# Patient Record
Sex: Female | Born: 1965 | Hispanic: No | Marital: Single | Smoking: Never smoker
Health system: Southern US, Community
[De-identification: ages and names within clinical notes are randomized; demographics above are authoritative.]

## PROBLEM LIST (undated history)

## (undated) DIAGNOSIS — G4730 Sleep apnea, unspecified: Secondary | ICD-10-CM

## (undated) DIAGNOSIS — K219 Gastro-esophageal reflux disease without esophagitis: Secondary | ICD-10-CM

## (undated) DIAGNOSIS — E079 Disorder of thyroid, unspecified: Secondary | ICD-10-CM

## (undated) DIAGNOSIS — E042 Nontoxic multinodular goiter: Secondary | ICD-10-CM

## (undated) DIAGNOSIS — F419 Anxiety disorder, unspecified: Secondary | ICD-10-CM

## (undated) HISTORY — DX: Disorder of thyroid, unspecified: E07.9

## (undated) HISTORY — PX: COLONOSCOPY: SHX174

## (undated) HISTORY — PX: ABDOMINAL HYSTERECTOMY: SHX81

## (undated) HISTORY — DX: Nontoxic multinodular goiter: E04.2

## (undated) HISTORY — DX: Sleep apnea, unspecified: G47.30

## (undated) HISTORY — DX: Anxiety disorder, unspecified: F41.9

## (undated) HISTORY — DX: Gastro-esophageal reflux disease without esophagitis: K21.9

---

## 1997-08-27 ENCOUNTER — Other Ambulatory Visit: Admission: RE | Admit: 1997-08-27 | Discharge: 1997-08-27 | Payer: Self-pay | Admitting: *Deleted

## 1997-09-06 ENCOUNTER — Ambulatory Visit (HOSPITAL_COMMUNITY): Admission: RE | Admit: 1997-09-06 | Discharge: 1997-09-06 | Payer: Self-pay | Admitting: *Deleted

## 1997-09-21 ENCOUNTER — Other Ambulatory Visit: Admission: RE | Admit: 1997-09-21 | Discharge: 1997-09-21 | Payer: Self-pay | Admitting: *Deleted

## 1997-10-04 ENCOUNTER — Other Ambulatory Visit: Admission: RE | Admit: 1997-10-04 | Discharge: 1997-10-04 | Payer: Self-pay | Admitting: *Deleted

## 1997-11-20 ENCOUNTER — Other Ambulatory Visit: Admission: RE | Admit: 1997-11-20 | Discharge: 1997-11-20 | Payer: Self-pay | Admitting: *Deleted

## 2016-11-26 ENCOUNTER — Emergency Department (HOSPITAL_COMMUNITY): Payer: Managed Care, Other (non HMO)

## 2016-11-26 ENCOUNTER — Encounter (HOSPITAL_COMMUNITY): Payer: Self-pay | Admitting: Emergency Medicine

## 2016-11-26 ENCOUNTER — Emergency Department (HOSPITAL_COMMUNITY)
Admission: EM | Admit: 2016-11-26 | Discharge: 2016-11-27 | Disposition: A | Discharge status: Home / Self Care | Payer: Managed Care, Other (non HMO) | Attending: Emergency Medicine | Admitting: Emergency Medicine

## 2016-11-26 DIAGNOSIS — R1011 Right upper quadrant pain: Secondary | ICD-10-CM | POA: Insufficient documentation

## 2016-11-26 LAB — COMPREHENSIVE METABOLIC PANEL
ALBUMIN: 3.9 g/dL (ref 3.5–5.0)
ALK PHOS: 70 U/L (ref 38–126)
ALT: 26 U/L (ref 14–54)
AST: 39 U/L (ref 15–41)
Anion gap: 10 (ref 5–15)
BILIRUBIN TOTAL: 0.4 mg/dL (ref 0.3–1.2)
BUN: 14 mg/dL (ref 6–20)
CALCIUM: 9.3 mg/dL (ref 8.9–10.3)
CO2: 24 mmol/L (ref 22–32)
CREATININE: 0.74 mg/dL (ref 0.44–1.00)
Chloride: 105 mmol/L (ref 101–111)
GFR calc non Af Amer: >60 mL/min (ref >60)
GLUCOSE: 110 mg/dL — AB (ref 65–99)
Potassium: 3.6 mmol/L (ref 3.5–5.1)
SODIUM: 139 mmol/L (ref 135–145)
TOTAL PROTEIN: 7.2 g/dL (ref 6.5–8.1)

## 2016-11-26 LAB — CBC
HCT: 39.3 % (ref 36.0–46.0)
Hemoglobin: 13.2 g/dL (ref 12.0–15.0)
MCH: 30.8 pg (ref 26.0–34.0)
MCHC: 33.6 g/dL (ref 30.0–36.0)
MCV: 91.8 fL (ref 78.0–100.0)
PLATELETS: 159 10*3/uL (ref 150–400)
RBC: 4.28 MIL/uL (ref 3.87–5.11)
RDW: 13.3 % (ref 11.5–15.5)
WBC: 7.9 10*3/uL (ref 4.0–10.5)

## 2016-11-26 LAB — URINALYSIS, ROUTINE W REFLEX MICROSCOPIC
Bilirubin Urine: NEGATIVE
GLUCOSE, UA: NEGATIVE mg/dL
Hgb urine dipstick: NEGATIVE
Ketones, ur: NEGATIVE mg/dL
Leukocytes, UA: NEGATIVE
NITRITE: POSITIVE — AB
PROTEIN: NEGATIVE mg/dL
SPECIFIC GRAVITY, URINE: 1.018 (ref 1.005–1.030)
pH: 8 (ref 5.0–8.0)

## 2016-11-26 LAB — LIPASE, BLOOD: Lipase: 19 U/L (ref 11–51)

## 2016-11-26 MED ORDER — FENTANYL CITRATE (PF) 100 MCG/2ML IJ SOLN
50.0000 ug | INTRAMUSCULAR | Status: DC | PRN
  Administered 2016-11-26: 50 ug via INTRAVENOUS
  Filled 2016-11-26: qty 2

## 2016-11-26 NOTE — ED Provider Notes (Signed)
[facility] DEPT Provider Note   CSN: [id number] Arrival date & time: 11/26/16  1847     History   Chief Complaint Chief Complaint  Patient presents with  . Abdominal Pain    HPI Tamara Snyder is a 51 y.o. female.  Patient present with acute onset of right upper quadrant abdominal pain that began around 6 PM today. Patient states pain is sharp constant and radiating around to her right upper back. She states pain was constant until she received fentanyl in triage here today. Reports pain significantly improved since then. Pain is associated with some nausea, no vomiting. She denies fever, diarrhea, constipation, urinary symptoms, chest pain, shortness of breath. Last BM was this morning. Denies history of gallstones. Previous abdominal surgeries include appendectomy and abdominal hysterectomy. She states she does not take daily medications. No other complaints today.      History reviewed. No pertinent past medical history.  There are no active problems to display for this patient.   Past Surgical History:  Procedure Laterality Date  . ABDOMINAL HYSTERECTOMY     partial    OB History    No data available       Home Medications    Prior to Admission medications   Medication Sig Start Date End Date Taking? Authorizing Provider  esomeprazole (NEXIUM) 40 MG capsule Take 40 mg by mouth once.   Yes [provider]  omeprazole (PRILOSEC) 40 MG capsule Take 40 mg by mouth daily as needed (indigestion).   Yes [provider]    Family History No family history on file.  Social History Social History  Substance Use Topics  . Smoking status: Not on file  . Smokeless tobacco: Not on file  . Alcohol use Not on file     Allergies   Other   Review of Systems Review of Systems  Constitutional: Positive for appetite change (slightly decreased today). Negative for fever.  HENT: Negative for trouble swallowing.   Respiratory: Negative for  shortness of breath.   Cardiovascular: Negative for chest pain.  Gastrointestinal: Positive for abdominal pain (RUQ) and nausea. Negative for constipation, diarrhea and vomiting.  Genitourinary: Negative for dysuria, frequency, vaginal bleeding and vaginal discharge.  Musculoskeletal: Positive for back pain (referred from abdomen).  Skin: Negative for color change.  Allergic/Immunologic: Negative for immunocompromised state.  Neurological: Negative for headaches.     Physical Exam Updated Vital Signs BP 105/69   Pulse 76   Temp 97.8 F (36.6 C) (Oral)   Resp 18   SpO2 97%   Physical Exam  Constitutional: She appears well-developed and well-nourished. No distress.  HENT:  Head: Normocephalic and atraumatic.  Mouth/Throat: Oropharynx is clear and moist.  Eyes: Conjunctivae are normal.  Cardiovascular: Normal rate, regular rhythm, normal heart sounds and intact distal pulses.  Exam reveals no friction rub.   No murmur heard. Pulmonary/Chest: Effort normal and breath sounds normal. No respiratory distress. She has no wheezes. She has no rales.  Abdominal: Soft. Normal appearance and bowel sounds are normal. She exhibits no distension and no mass. There is tenderness in the right upper quadrant and epigastric area. There is positive Murphy's sign. There is no rebound, no guarding, no CVA tenderness and no tenderness at McBurney's point.  Neurological: She is alert.  Skin: Skin is warm.  Psychiatric: She has a normal mood and affect. Her behavior is normal.  Nursing note and vitals reviewed.    ED Treatments / Results  Labs (all labs ordered are listed,  but only abnormal results are displayed) Labs Reviewed  COMPREHENSIVE METABOLIC PANEL - Abnormal; Notable for the following:       Result Value   Glucose, Bld 110 (*)    All other components within normal limits  URINALYSIS, ROUTINE W REFLEX MICROSCOPIC - Abnormal; Notable for the following:    APPearance HAZY (*)    Nitrite  POSITIVE (*)    Bacteria, UA MANY (*)    Squamous Epithelial / LPF 0-5 (*)    All other components within normal limits  LIPASE, BLOOD  CBC    EKG  EKG Interpretation None       Radiology US Abdomen Limited Ruq  Result Date: 11/26/2016 CLINICAL DATA:  Acute onset of right upper quadrant abdominal pain. Initial encounter. EXAM: ULTRASOUND ABDOMEN LIMITED RIGHT UPPER QUADRANT COMPARISON:  CT of the abdomen and pelvis performed 11/02/2008 FINDINGS: Gallbladder: Two small polyps are noted within the gallbladder, measuring up to 5 mm in size. Mild sludge is noted within the gallbladder. No gallbladder wall thickening or pericholecystic fluid is seen. No ultrasonographic Murphy's sign is elicited. Common bile duct: Diameter: 0.5 cm, within normal limits in caliber. Liver: No focal lesion identified. A mildly complex cyst is noted at the left hepatic lobe, measuring 1.2 cm in size, with a thin septation. IMPRESSION: 1. No acute abnormality seen at the right upper quadrant. 2. Small gallbladder polyps noted, with mild sludge in the gallbladder. Gallbladder otherwise unremarkable. 3. Mildly complex left hepatic cyst noted. Electronically Signed   By: Garald Balding M.D.   On: 11/26/2016 23:11    Procedures Procedures (including critical care time)  Medications Ordered in ED Medications  fentaNYL (SUBLIMAZE) injection 50 mcg (50 mcg Intravenous Given 11/26/16 1912)     Initial Impression / Assessment and Plan / ED Course  I have reviewed the triage vital signs and the nursing notes.  Pertinent labs & imaging results that were available during my care of the patient were reviewed by me and considered in my medical decision making (see chart for details).     Pt w RUQ abdominal pain. Patient is nontoxic, nonseptic appearing, in no apparent distress.  Patient's pain and other symptoms adequately managed in emergency department. Labs, imaging and vitals reviewed. Labs unremarkable, patient is  afebrile. Right upper quadrant ultrasound with sludge in the gallbladder, no gallstones, no cholecystitis. Patient does not meet the SIRS or Sepsis criteria.  On repeat exam patient does not have a surgical abdomen and there are no peritoneal signs. Patient discharged home with symptomatic treatment and given strict instructions for follow-up with their primary care physician.  Pt safe for discharge.  Patient discussed with Dr. Regenia Skeeter.  Discussed results, findings, treatment and follow up. Patient advised of return precautions. Patient verbalized understanding and agreed with plan.  Final Clinical Impressions(s) / ED Diagnoses   Final diagnoses:  RUQ abdominal pain    New Prescriptions New Prescriptions   No medications on file     Russo, [place] N, PA-C 11/27/16 0008    Sherwood Gambler, MD 11/29/16 2245

## 2016-11-26 NOTE — ED Triage Notes (Addendum)
Pt c/o severe RUQ abdominal pain, radiating around to right upper back, nausea onset today at 1800 while in car. No emesis or diarrhea. No hx gall bladder disease.

## 2016-11-27 NOTE — Discharge Instructions (Signed)
Please read instructions below. You can take advil or tylenol as needed for pain. Follow up with your primary care provider about your ultrasound results. Return to the ER for worsening pain, or new or concerning symptoms.

## 2017-07-21 IMAGING — US US ABDOMEN LIMITED
1 series · 14 of 25 positions shown · non-contrast
Comparison: CT of the abdomen and pelvis performed 11/02/2008

CLINICAL DATA: Acute onset of right upper quadrant abdominal pain.
Initial encounter.

EXAM:
ULTRASOUND ABDOMEN LIMITED RIGHT UPPER QUADRANT

[Series 1: us abdomen limited · 0.22mm/px · 14 of 59 slices shown]
[im 1/59]
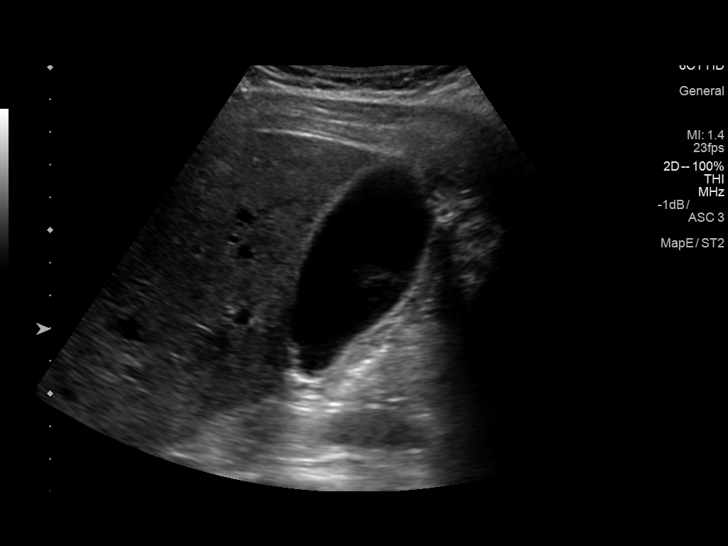
[im 5/59]
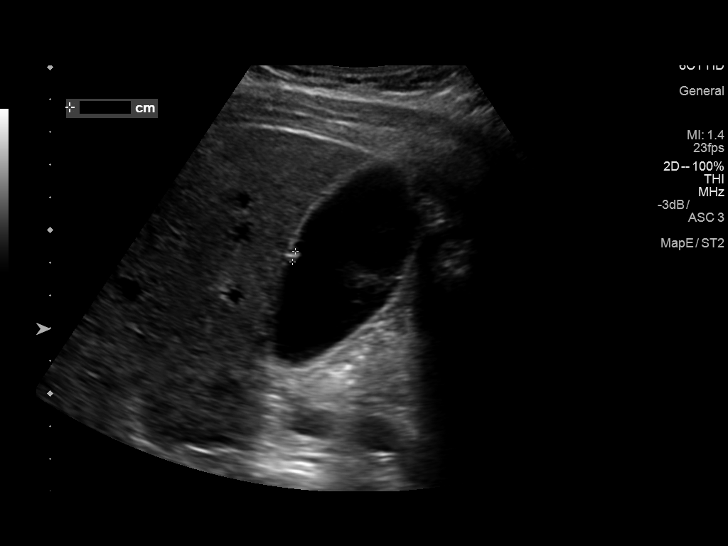
[im 10/59]
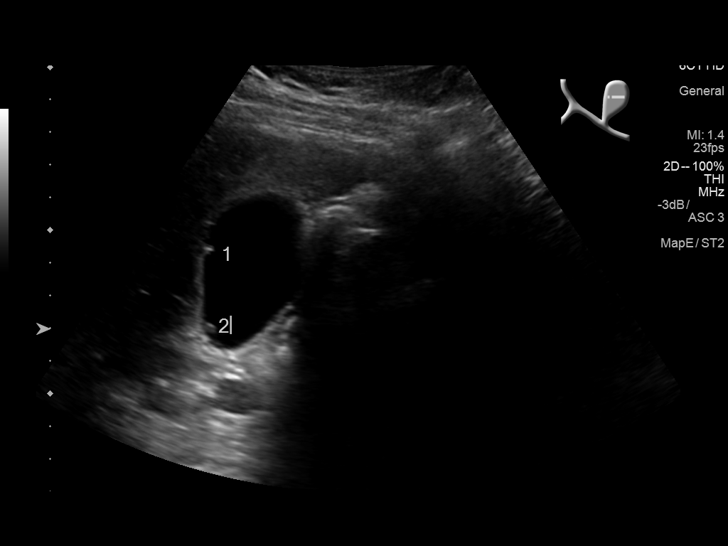
[im 15/59]
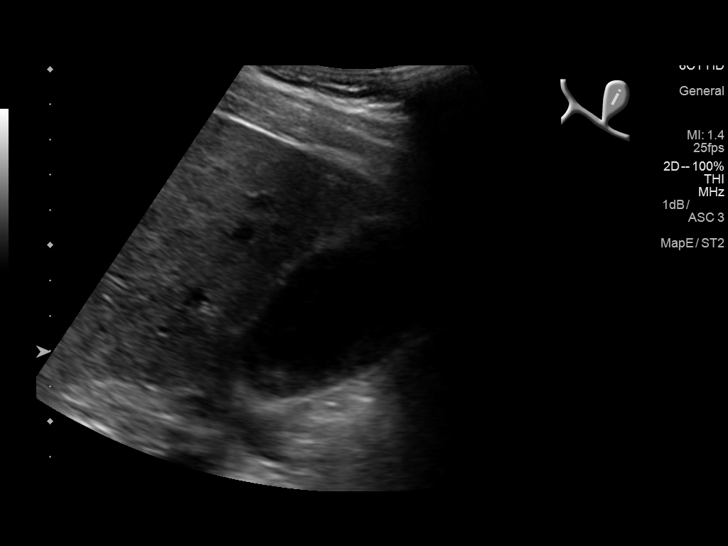
[im 20/59]
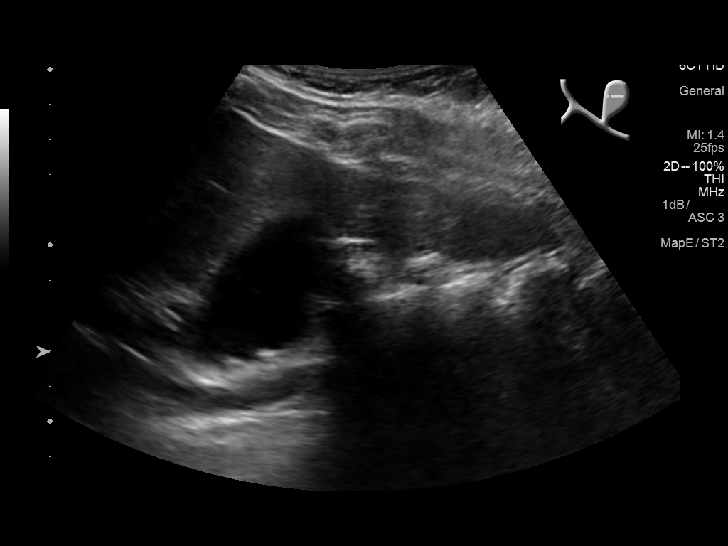
[im 22/59]
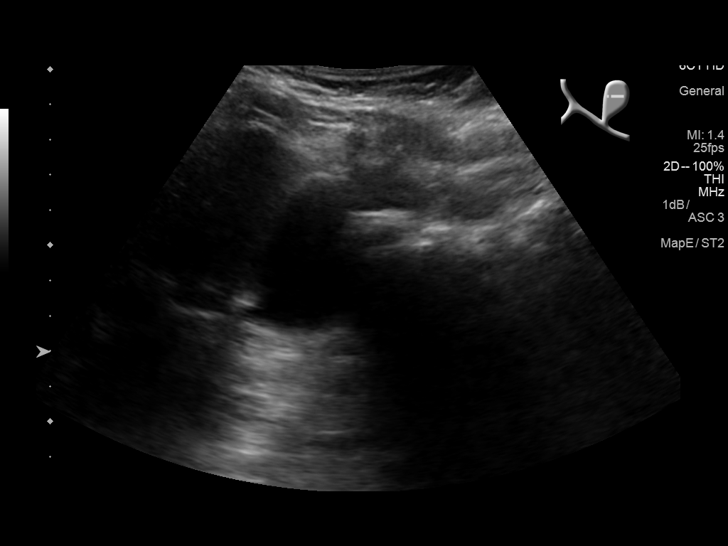
[im 27/59]
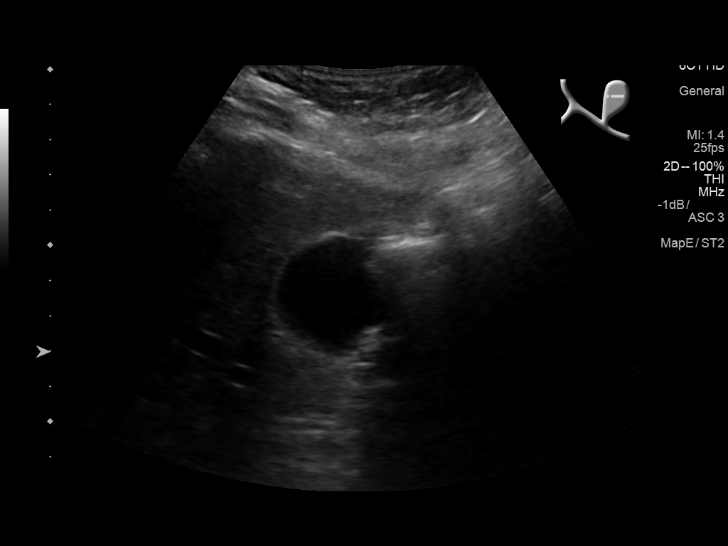
[im 32/59]
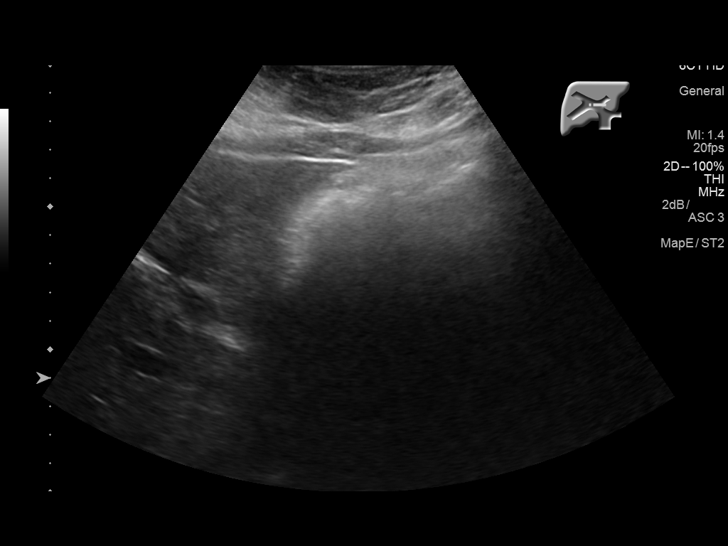
[im 37/59]
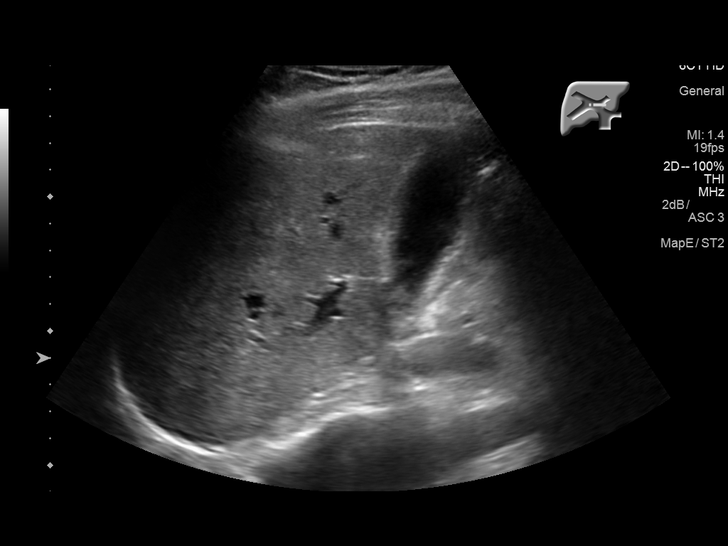
[im 39/59]
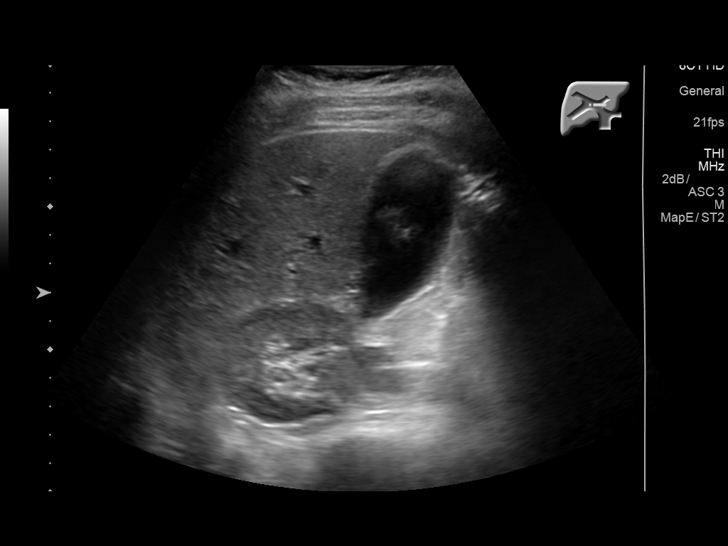
[im 44/59]
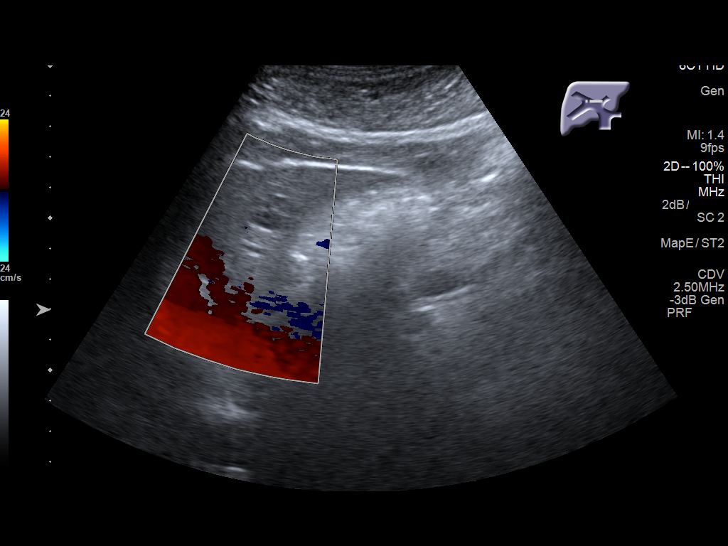
[im 49/59]
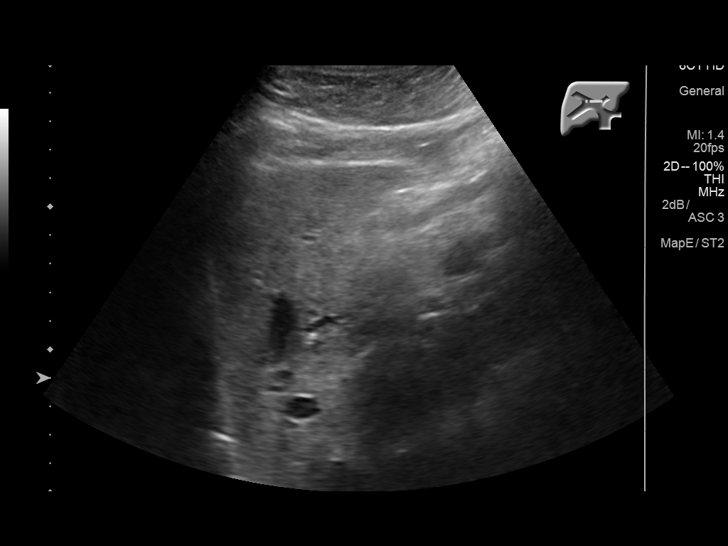
[im 54/59]
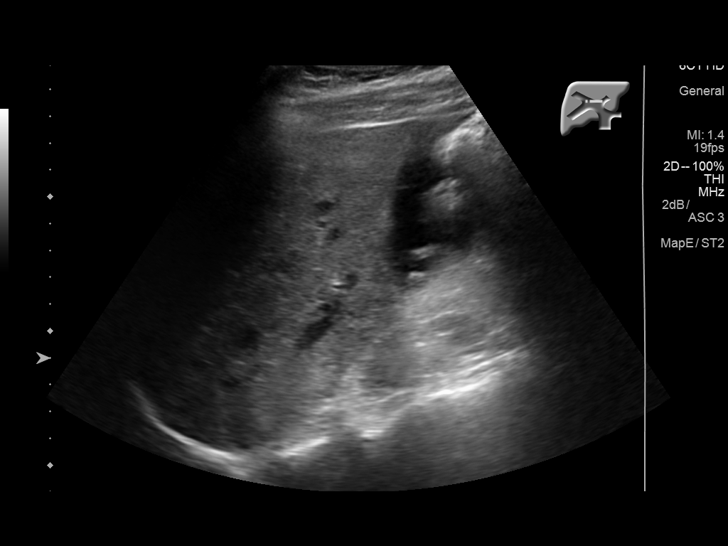
[im 59/59]
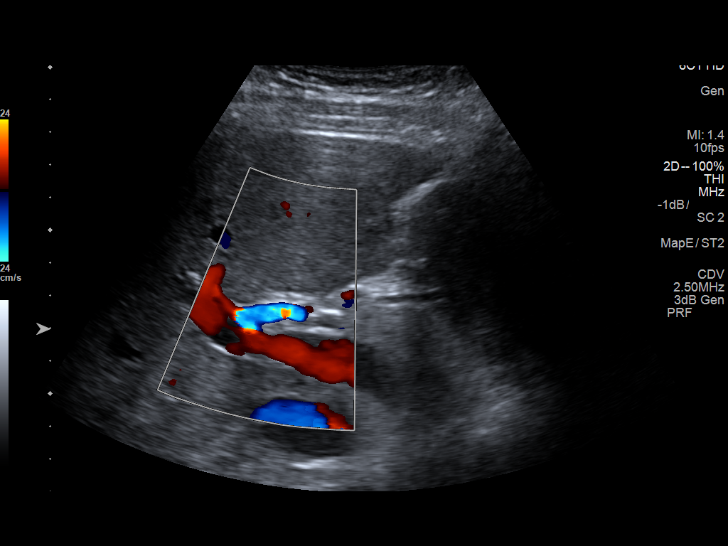

[14 of 25 positions shown; findings below may reference images not displayed]

FINDINGS: Gallbladder:

Two small polyps are noted within the gallbladder, measuring up to 5
mm in size. Mild sludge is noted within the gallbladder. No
gallbladder wall thickening or pericholecystic fluid is seen. No
ultrasonographic Murphy's sign is elicited.

Common bile duct:

Diameter: 0.5 cm, within normal limits in caliber.

Liver:

No focal lesion identified. A mildly complex cyst is noted at the
left hepatic lobe, measuring 1.2 cm in size, with a thin septation.
IMPRESSION: 1. No acute abnormality seen at the right upper quadrant.
2. Small gallbladder polyps noted, with mild sludge in the
gallbladder. Gallbladder otherwise unremarkable.
3. Mildly complex left hepatic cyst noted.

## 2017-09-20 DIAGNOSIS — Z79899 Other long term (current) drug therapy: Secondary | ICD-10-CM | POA: Diagnosis not present

## 2017-09-20 DIAGNOSIS — K219 Gastro-esophageal reflux disease without esophagitis: Secondary | ICD-10-CM | POA: Diagnosis not present

## 2017-09-20 DIAGNOSIS — K8010 Calculus of gallbladder with chronic cholecystitis without obstruction: Secondary | ICD-10-CM | POA: Diagnosis not present

## 2017-12-10 DIAGNOSIS — Z1339 Encounter for screening examination for other mental health and behavioral disorders: Secondary | ICD-10-CM | POA: Diagnosis not present

## 2017-12-10 DIAGNOSIS — N951 Menopausal and female climacteric states: Secondary | ICD-10-CM | POA: Diagnosis not present

## 2017-12-10 DIAGNOSIS — Z1331 Encounter for screening for depression: Secondary | ICD-10-CM | POA: Diagnosis not present

## 2017-12-10 DIAGNOSIS — M2550 Pain in unspecified joint: Secondary | ICD-10-CM | POA: Diagnosis not present

## 2017-12-13 DIAGNOSIS — Z13220 Encounter for screening for lipoid disorders: Secondary | ICD-10-CM | POA: Diagnosis not present

## 2017-12-13 DIAGNOSIS — M2550 Pain in unspecified joint: Secondary | ICD-10-CM | POA: Diagnosis not present

## 2017-12-13 DIAGNOSIS — Z79899 Other long term (current) drug therapy: Secondary | ICD-10-CM | POA: Diagnosis not present

## 2017-12-13 DIAGNOSIS — Z0000 Encounter for general adult medical examination without abnormal findings: Secondary | ICD-10-CM | POA: Diagnosis not present

## 2017-12-13 DIAGNOSIS — N951 Menopausal and female climacteric states: Secondary | ICD-10-CM | POA: Diagnosis not present

## 2017-12-17 DIAGNOSIS — M71572 Other bursitis, not elsewhere classified, left ankle and foot: Secondary | ICD-10-CM | POA: Diagnosis not present

## 2017-12-17 DIAGNOSIS — M722 Plantar fascial fibromatosis: Secondary | ICD-10-CM | POA: Diagnosis not present

## 2017-12-17 DIAGNOSIS — M7731 Calcaneal spur, right foot: Secondary | ICD-10-CM | POA: Diagnosis not present

## 2017-12-17 DIAGNOSIS — M71571 Other bursitis, not elsewhere classified, right ankle and foot: Secondary | ICD-10-CM | POA: Diagnosis not present

## 2017-12-17 DIAGNOSIS — M7732 Calcaneal spur, left foot: Secondary | ICD-10-CM | POA: Diagnosis not present

## 2017-12-23 DIAGNOSIS — M722 Plantar fascial fibromatosis: Secondary | ICD-10-CM | POA: Diagnosis not present

## 2017-12-23 DIAGNOSIS — M71572 Other bursitis, not elsewhere classified, left ankle and foot: Secondary | ICD-10-CM | POA: Diagnosis not present

## 2017-12-23 DIAGNOSIS — M71571 Other bursitis, not elsewhere classified, right ankle and foot: Secondary | ICD-10-CM | POA: Diagnosis not present

## 2017-12-31 DIAGNOSIS — M722 Plantar fascial fibromatosis: Secondary | ICD-10-CM | POA: Diagnosis not present

## 2017-12-31 DIAGNOSIS — M71571 Other bursitis, not elsewhere classified, right ankle and foot: Secondary | ICD-10-CM | POA: Diagnosis not present

## 2017-12-31 DIAGNOSIS — M71572 Other bursitis, not elsewhere classified, left ankle and foot: Secondary | ICD-10-CM | POA: Diagnosis not present

## 2018-04-01 DIAGNOSIS — Z23 Encounter for immunization: Secondary | ICD-10-CM | POA: Diagnosis not present

## 2018-04-13 DIAGNOSIS — Z1211 Encounter for screening for malignant neoplasm of colon: Secondary | ICD-10-CM | POA: Diagnosis not present

## 2018-04-13 DIAGNOSIS — D124 Benign neoplasm of descending colon: Secondary | ICD-10-CM | POA: Diagnosis not present

## 2018-04-13 DIAGNOSIS — Z800 Family history of malignant neoplasm of digestive organs: Secondary | ICD-10-CM | POA: Diagnosis not present

## 2018-05-25 DIAGNOSIS — J411 Mucopurulent chronic bronchitis: Secondary | ICD-10-CM | POA: Diagnosis not present

## 2020-05-25 HISTORY — PX: FINGER SURGERY: SHX640

## 2020-12-22 ENCOUNTER — Encounter (HOSPITAL_COMMUNITY): Payer: Self-pay | Admitting: *Deleted

## 2020-12-22 ENCOUNTER — Ambulatory Visit (INDEPENDENT_AMBULATORY_CARE_PROVIDER_SITE_OTHER): Payer: 59

## 2020-12-22 ENCOUNTER — Other Ambulatory Visit: Payer: Self-pay

## 2020-12-22 ENCOUNTER — Ambulatory Visit (HOSPITAL_COMMUNITY)
Admission: EM | Admit: 2020-12-22 | Discharge: 2020-12-22 | Disposition: A | Discharge status: Home / Self Care | Payer: 59 | Attending: Internal Medicine | Admitting: Internal Medicine

## 2020-12-22 DIAGNOSIS — S62630A Displaced fracture of distal phalanx of right index finger, initial encounter for closed fracture: Secondary | ICD-10-CM | POA: Diagnosis not present

## 2020-12-22 NOTE — ED Triage Notes (Signed)
Pt injured Rt middle finger this AM . Deformity to RT middle finger.

## 2020-12-22 NOTE — ED Provider Notes (Signed)
[facility]    CSN: [id number] Arrival date & time: 12/22/20  1010      History   Chief Complaint Chief Complaint  Patient presents with   Finger Injury    HPI Tamara Snyder is a 55 y.o. female.   Patient presenting today with 1 day history of right pointer finger pain after she was wiping her couch down and her finger caught on something and she heard an audible pop.  Has had a deformity to the distal fingertip since incident and numbness of the fingertip.  Denies discoloration, coolness, skin injury, other areas of pain on the hand.  So far is not tried anything for symptoms, came straight here.   History reviewed. No pertinent past medical history.  There are no problems to display for this patient.   Past Surgical History:  Procedure Laterality Date   ABDOMINAL HYSTERECTOMY     partial    OB History   No obstetric history on file.      Home Medications    Prior to Admission medications   Medication Sig Start Date End Date Taking? Authorizing Provider  esomeprazole (NEXIUM) 40 MG capsule Take 40 mg by mouth once.    [provider]  omeprazole (PRILOSEC) 40 MG capsule Take 40 mg by mouth daily as needed (indigestion).    [provider]    Family History History reviewed. No pertinent family history.  Social History Social History   Tobacco Use   Smoking status: Never   Smokeless tobacco: Never     Allergies   Other   Review of Systems Review of Systems Per HPI  Physical Exam Triage Vital Signs ED Triage Vitals  Enc Vitals Group     BP 12/22/20 1046 128/88     Pulse Rate 12/22/20 1046 81     Resp 12/22/20 1046 20     Temp 12/22/20 1046 98.3 F (36.8 C)     Temp src --      SpO2 12/22/20 1046 100 %     Weight --      Height --      Head Circumference --      Peak Flow --      Pain Score 12/22/20 1043 5     Pain Loc --      Pain Edu? --      Excl. in [place]? --    No data found.  Updated Vital  Signs BP 128/88   Pulse 81   Temp 98.3 F (36.8 C)   Resp 20   SpO2 100%   Visual Acuity Right Eye Distance:   Left Eye Distance:   Bilateral Distance:    Right Eye Near:   Left Eye Near:    Bilateral Near:     Physical Exam Vitals and nursing note reviewed.  Constitutional:      Appearance: Normal appearance. She is not ill-appearing.  HENT:     Head: Atraumatic.  Eyes:     Extraocular Movements: Extraocular movements intact.     Conjunctiva/sclera: Conjunctivae normal.  Cardiovascular:     Rate and Rhythm: Normal rate and regular rhythm.     Pulses: Normal pulses.     Heart sounds: Normal heart sounds.     Comments: Normal cap refill 2 seconds right distal finger Pulmonary:     Effort: Pulmonary effort is normal.     Breath sounds: Normal breath sounds.  Musculoskeletal:        General: Swelling, tenderness, deformity  and signs of injury present. Normal range of motion.     Cervical back: Normal range of motion and neck supple.     Comments: Obvious deformity to distal right index finger, tender to palpation on dorsal aspect.  No range of motion at this joint  Skin:    General: Skin is warm and dry.     Findings: No erythema.  Neurological:     Mental Status: She is alert and oriented to person, place, and time.     Comments: Decreased sensation to light touch  Psychiatric:        Mood and Affect: Mood normal.        Thought Content: Thought content normal.        Judgment: Judgment normal.    UC Treatments / Results  Labs (all labs ordered are listed, but only abnormal results are displayed) Labs Reviewed - No data to display  EKG   Radiology DG Finger Middle Right  Result Date: 12/22/2020 CLINICAL DATA:  Finger got stuck in a rag while cleaning her leather couch this morning. No previous injuries EXAM: RIGHT MIDDLE FINGER 2+V COMPARISON:  None. FINDINGS: There is a small avulsion fracture at the base of the distal phalanx of the middle finger best seen  on lateral view. The fracture fragment is displaced by approximately 4 mm. No evidence of dislocation. No other acute finding identified in the middle finger. Regional soft tissues unremarkable. IMPRESSION: Small avulsion fracture at the base of the distal phalanx of the right middle finger. Electronically Signed   By: Audie Pinto M.D.   On: 12/22/2020 11:00    Procedures Procedures (including critical care time)  Medications Ordered in UC Medications - No data to display  Initial Impression / Assessment and Plan / UC Course  I have reviewed the triage vital signs and the nursing notes.  Pertinent labs & imaging results that were available during my care of the patient were reviewed by me and considered in my medical decision making (see chart for details).     X-ray revealing avulsion fracture to right distal phalanx, finger splint placed and orthopedic information given with recommendation to call first thing tomorrow morning for appointment.  Restrictions reviewed, ED for any discoloration, coolness or worsening loss of sensation.  RICE protocol also reviewed, over-the-counter pain relievers as needed.  Final Clinical Impressions(s) / UC Diagnoses   Final diagnoses:  Closed displaced fracture of distal phalanx of right index finger, initial encounter   Discharge Instructions   None    ED Prescriptions   None    PDMP not reviewed this encounter.   Volney American, [place] 12/22/20 1132

## 2020-12-31 ENCOUNTER — Other Ambulatory Visit: Payer: Self-pay | Admitting: Internal Medicine

## 2020-12-31 DIAGNOSIS — E042 Nontoxic multinodular goiter: Secondary | ICD-10-CM

## 2021-01-07 ENCOUNTER — Ambulatory Visit
Admission: RE | Admit: 2021-01-07 | Discharge: 2021-01-07 | Disposition: A | Discharge status: Home / Self Care | Payer: 59 | Source: Ambulatory Visit | Attending: Internal Medicine | Admitting: Internal Medicine

## 2021-01-07 ENCOUNTER — Other Ambulatory Visit: Payer: Self-pay

## 2021-01-07 DIAGNOSIS — E042 Nontoxic multinodular goiter: Secondary | ICD-10-CM

## 2022-01-05 ENCOUNTER — Other Ambulatory Visit: Payer: Self-pay | Admitting: *Deleted

## 2022-01-05 DIAGNOSIS — E042 Nontoxic multinodular goiter: Secondary | ICD-10-CM

## 2022-01-16 ENCOUNTER — Other Ambulatory Visit: Payer: Self-pay | Admitting: Family Medicine

## 2022-01-16 DIAGNOSIS — Z1231 Encounter for screening mammogram for malignant neoplasm of breast: Secondary | ICD-10-CM

## 2022-01-19 ENCOUNTER — Ambulatory Visit
Admission: RE | Admit: 2022-01-19 | Discharge: 2022-01-19 | Disposition: A | Discharge status: Home / Self Care | Payer: 59 | Source: Ambulatory Visit | Attending: Family Medicine | Admitting: Family Medicine

## 2022-01-19 ENCOUNTER — Ambulatory Visit
Admission: RE | Admit: 2022-01-19 | Discharge: 2022-01-19 | Disposition: A | Discharge status: Home / Self Care | Payer: 59 | Source: Ambulatory Visit | Attending: Internal Medicine | Admitting: Internal Medicine

## 2022-01-19 DIAGNOSIS — Z1231 Encounter for screening mammogram for malignant neoplasm of breast: Secondary | ICD-10-CM

## 2022-01-19 DIAGNOSIS — E042 Nontoxic multinodular goiter: Secondary | ICD-10-CM

## 2022-01-20 ENCOUNTER — Encounter: Payer: Self-pay | Admitting: Gastroenterology

## 2022-02-02 ENCOUNTER — Other Ambulatory Visit: Payer: Self-pay

## 2022-02-02 ENCOUNTER — Telehealth: Payer: Self-pay | Admitting: *Deleted

## 2022-02-02 ENCOUNTER — Encounter: Payer: Self-pay | Admitting: Gastroenterology

## 2022-02-02 ENCOUNTER — Ambulatory Visit (AMBULATORY_SURGERY_CENTER): Payer: Self-pay | Admitting: *Deleted

## 2022-02-02 VITALS — Ht 66.0 in | Wt 192.0 lb

## 2022-02-02 DIAGNOSIS — Z86010 Personal history of colonic polyps: Secondary | ICD-10-CM

## 2022-02-02 MED ORDER — NA SULFATE-K SULFATE-MG SULF 17.5-3.13-1.6 GM/177ML PO SOLN: 1.0000 | Freq: Once | ORAL | 0 refills | Status: AC

## 2022-02-02 NOTE — Progress Notes (Signed)
Completed pre visit with patient in person today. Patient has chronic constipation that is treated with daily Miralax. Patient will continue 1-2 times daily starting 1 week before colonoscopy.  No egg or soy allergy known to patient  No issues known to pt with past sedation with any surgeries or procedures Patient denies ever being told they had issues or difficulty with intubation  No FH of Malignant Hyperthermia Pt is not on diet pills Pt is not on  home 02  Pt is not on blood thinners  Pt denies issues with constipation  No A fib or A flutter  Pt instructed to use Single[employer]com or GoodRx for a price reduction on prep

## 2022-02-02 NOTE — Telephone Encounter (Signed)
Dr Lyndel Safe, This patient is transferring care to you from Dr Melina Copa.  She is on Pantoprazole for known GERD.  She has been having increased symptoms lately and was inquiring if she could have an EGD with her colonoscopy. Please advise. Thanks.  Raquel Sarna

## 2022-02-12 ENCOUNTER — Telehealth: Payer: Self-pay | Admitting: Gastroenterology

## 2022-02-12 NOTE — Telephone Encounter (Signed)
Good morning patient request a call back , she want to go over pre-visit instructions, she is not home and don't have paper with her.

## 2022-02-12 NOTE — Telephone Encounter (Signed)
Spoke with the patient. Went over prep instructions because she was at the beach and did not have them with her. Answered all questions.

## 2022-02-16 ENCOUNTER — Encounter: Payer: Self-pay | Admitting: Gastroenterology

## 2022-02-16 ENCOUNTER — Ambulatory Visit (AMBULATORY_SURGERY_CENTER): Payer: 59 | Admitting: Gastroenterology

## 2022-02-16 VITALS — BP 111/73 | HR 76 | Temp 97.8°F | Resp 17 | Ht 67.0 in | Wt 192.0 lb

## 2022-02-16 DIAGNOSIS — K317 Polyp of stomach and duodenum: Secondary | ICD-10-CM | POA: Diagnosis not present

## 2022-02-16 DIAGNOSIS — Z800 Family history of malignant neoplasm of digestive organs: Secondary | ICD-10-CM | POA: Diagnosis not present

## 2022-02-16 DIAGNOSIS — D122 Benign neoplasm of ascending colon: Secondary | ICD-10-CM

## 2022-02-16 DIAGNOSIS — Z09 Encounter for follow-up examination after completed treatment for conditions other than malignant neoplasm: Secondary | ICD-10-CM

## 2022-02-16 DIAGNOSIS — R12 Heartburn: Secondary | ICD-10-CM | POA: Diagnosis not present

## 2022-02-16 DIAGNOSIS — K449 Diaphragmatic hernia without obstruction or gangrene: Secondary | ICD-10-CM | POA: Diagnosis not present

## 2022-02-16 DIAGNOSIS — Q398 Other congenital malformations of esophagus: Secondary | ICD-10-CM

## 2022-02-16 DIAGNOSIS — K219 Gastro-esophageal reflux disease without esophagitis: Secondary | ICD-10-CM

## 2022-02-16 DIAGNOSIS — K2900 Acute gastritis without bleeding: Secondary | ICD-10-CM

## 2022-02-16 DIAGNOSIS — K319 Disease of stomach and duodenum, unspecified: Secondary | ICD-10-CM | POA: Diagnosis not present

## 2022-02-16 DIAGNOSIS — Z86010 Personal history of colonic polyps: Secondary | ICD-10-CM

## 2022-02-16 DIAGNOSIS — D124 Benign neoplasm of descending colon: Secondary | ICD-10-CM

## 2022-02-16 MED ORDER — SODIUM CHLORIDE 0.9 % IV SOLN: 500.0000 mL | Freq: Once | INTRAVENOUS | Status: DC

## 2022-02-16 MED ORDER — PANTOPRAZOLE SODIUM 20 MG PO TBEC: 20.0000 mg | DELAYED_RELEASE_TABLET | Freq: Two times a day (BID) | ORAL | 4 refills | Status: DC

## 2022-02-16 NOTE — Progress Notes (Signed)
[facility] History and Physical   Primary Care Physician:  Mateo Flow, MD   Reason for Procedure:   FH CRC (dad), GERD  Plan:    EGD/colon     HPI: Tamara Snyder is a 56 y.o. female    Past Medical History:  Diagnosis Date   Anxiety    GERD (gastroesophageal reflux disease)    Multiple thyroid nodules    Sleep apnea    dental appliance   Thyroid disease     Past Surgical History:  Procedure Laterality Date   ABDOMINAL HYSTERECTOMY     partial   COLONOSCOPY     FINGER SURGERY Right 2022    Prior to Admission medications   Medication Sig Start Date End Date Taking? Authorizing Provider  Cholecalciferol 50 MCG (2000 UT) TABS 1 tablet Orally Once a day   Yes [provider]  escitalopram (LEXAPRO) 10 MG tablet Take 10 mg by mouth every morning. 01/12/22  Yes [provider]  pantoprazole (PROTONIX) 20 MG tablet Take 20 mg by mouth daily. 01/12/22  Yes [provider]  esomeprazole (NEXIUM) 40 MG capsule Take 40 mg by mouth once.    [provider]  ibuprofen (ADVIL) 400 MG tablet Take 400 mg by mouth every 6 (six) hours as needed.    [provider]  omeprazole (PRILOSEC) 40 MG capsule Take 40 mg by mouth daily as needed (indigestion).    [provider]    Current Outpatient Medications  Medication Sig Dispense Refill   Cholecalciferol 50 MCG (2000 UT) TABS 1 tablet Orally Once a day     escitalopram (LEXAPRO) 10 MG tablet Take 10 mg by mouth every morning.     pantoprazole (PROTONIX) 20 MG tablet Take 20 mg by mouth daily.     esomeprazole (NEXIUM) 40 MG capsule Take 40 mg by mouth once.     ibuprofen (ADVIL) 400 MG tablet Take 400 mg by mouth every 6 (six) hours as needed.     omeprazole (PRILOSEC) 40 MG capsule Take 40 mg by mouth daily as needed (indigestion).     Current Facility-Administered Medications  Medication Dose Route Frequency Provider Last Rate Last Admin   0.9 %  sodium  chloride infusion  500 mL Intravenous Once Jackquline Denmark, MD        Allergies as of 02/16/2022 - Review Complete 02/16/2022  Allergen Reaction Noted   Other  11/26/2016    Family History  Problem Relation Age of Onset   Colon cancer Father    Esophageal cancer Neg Hx    Stomach cancer Neg Hx    Rectal cancer Neg Hx     Social History   Socioeconomic History   Marital status: Single    Spouse name: Not on file   Number of children: Not on file   Years of education: Not on file   Highest education level: Not on file  Occupational History   Not on file  Tobacco Use   Smoking status: Never   Smokeless tobacco: Never  Substance and Sexual Activity   Alcohol use: Not on file   Drug use: Not on file   Sexual activity: Not on file  Other Topics Concern   Not on file  Social History Narrative   Not on file   Social Determinants of Health   Financial Resource Strain: Not on file  Food Insecurity: Not on file  Transportation Needs: Not on file  Physical Activity: Not on file  Stress:  Not on file  Social Connections: Not on file  Intimate Partner Violence: Not on file    Review of Systems: Positive for none All other review of systems negative except as mentioned in the HPI.  Physical Exam: Vital signs in last 24 hours: '@VSRANGES'$ @   General:   Alert,  Well-developed, well-nourished, pleasant and cooperative in NAD Lungs:  Clear throughout to auscultation.   Heart:  Regular rate and rhythm; no murmurs, clicks, rubs,  or gallops. Abdomen:  Soft, nontender and nondistended. Normal bowel sounds.   Neuro/Psych:  Alert and cooperative. Normal mood and affect. A and O x 3    No significant changes were identified.  The patient continues to be an appropriate candidate for the planned procedure and anesthesia.   Carmell Austria, MD. [facility] 02/16/2022 2:11 PM@

## 2022-02-16 NOTE — Progress Notes (Signed)
Pt's states no medical or surgical changes since previsit or office visit. 

## 2022-02-16 NOTE — Progress Notes (Signed)
PT taken to PACU. Monitors in place. VSS. Report given to RN. 

## 2022-02-16 NOTE — Op Note (Addendum)
[facility] Patient Name: Tamara Snyder Procedure Date: 02/16/2022 2:47 PM MRN: [id number] Endoscopist: Jackquline Denmark , MD Age: 56 Referring MD:  Date of Birth: 03-Aug-1965 Gender: Female Account #: [id number] Procedure:                Colonoscopy Indications:              High risk colon cancer surveillance: Personal                            history of colonic polyps. FH CRC (dad) Medicines:                Monitored Anesthesia Care Procedure:                Pre-Anesthesia Assessment:                           - Prior to the procedure, a History and Physical                            was performed, and patient medications and                            allergies were reviewed. The patient's tolerance of                            previous anesthesia was also reviewed. The risks                            and benefits of the procedure and the sedation                            options and risks were discussed with the patient.                            All questions were answered, and informed consent                            was obtained. Prior Anticoagulants: The patient has                            taken no previous anticoagulant or antiplatelet                            agents. ASA Grade Assessment: II - A patient with                            mild systemic disease. After reviewing the risks                            and benefits, the patient was deemed in                            satisfactory condition to undergo the procedure.  After obtaining informed consent, the colonoscope                            was passed under direct vision. Throughout the                            procedure, the patient's blood pressure, pulse, and                            oxygen saturations were monitored continuously. The                            Olympus PCF-H190DL ([id number]) Colonoscope was                            introduced through the  anus and advanced to the 2                            cm into the ileum. The colonoscopy was performed                            without difficulty. The patient tolerated the                            procedure well. The quality of the bowel                            preparation was good. The terminal ileum, ileocecal                            valve, appendiceal orifice, and rectum were                            photographed. Scope In: 3:09:54 PM Scope Out: 3:30:02 PM Scope Withdrawal Time: 0 hours 14 minutes 5 seconds  Total Procedure Duration: 0 hours 20 minutes 8 seconds  Findings:                 Two sessile polyps were found in the proximal                            descending colon and mid ascending colon. The                            polyps were 4 to 6 mm in size. These polyps were                            removed with a cold snare. Resection and retrieval                            were complete.                           Multiple medium-mouthed diverticula were found in  the sigmoid colon, few in descending colon and                            ascending colon.                           Non-bleeding internal hemorrhoids were found during                            retroflexion. The hemorrhoids were small and Grade                            I (internal hemorrhoids that do not prolapse).                           The terminal ileum appeared normal.                           The exam was otherwise without abnormality on                            direct and retroflexion views. Complications:            No immediate complications. Estimated Blood Loss:     Estimated blood loss: none. Impression:               - Two 4 to 6 mm polyps in the proximal descending                            colon and in the mid ascending colon, removed with                            a cold snare. Resected and retrieved.                           - Pancolonic  diverticulosis predominantly in the                            sigmoid colon.                           - Non-bleeding internal hemorrhoids.                           - The examined portion of the ileum was normal.                           - The examination was otherwise normal on direct                            and retroflexion views. Recommendation:           - Patient has a contact number available for                            emergencies. The signs and symptoms of potential  delayed complications were discussed with the                            patient. Return to normal activities tomorrow.                            Written discharge instructions were provided to the                            patient.                           - Resume previous high-fiber diet.                           - Continue present medications.                           - Await pathology results.                           - Repeat colonoscopy for surveillance based on                            pathology results.                           - The findings and recommendations were discussed                            with the patient's family. Jackquline Denmark, MD 02/16/2022 3:35:38 PM This report has been signed electronically.

## 2022-02-16 NOTE — Telephone Encounter (Signed)
Absolutely lets set her up for EGD at the time of colonoscopy RG

## 2022-02-16 NOTE — Op Note (Addendum)
[facility] Patient Name: Tamara Snyder Procedure Date: 02/16/2022 2:48 PM MRN: [id number] Endoscopist: Jackquline Denmark , MD Age: 56 Referring MD:  Date of Birth: July 14, 1965 Gender: Female Account #: [id number] Procedure:                Upper GI endoscopy Indications:              Heartburn despite PPIs Medicines:                Monitored Anesthesia Care Procedure:                Pre-Anesthesia Assessment:                           - Prior to the procedure, a History and Physical                            was performed, and patient medications and                            allergies were reviewed. The patient's tolerance of                            previous anesthesia was also reviewed. The risks                            and benefits of the procedure and the sedation                            options and risks were discussed with the patient.                            All questions were answered, and informed consent                            was obtained. Prior Anticoagulants: The patient has                            taken no previous anticoagulant or antiplatelet                            agents. ASA Grade Assessment: II - A patient with                            mild systemic disease. After reviewing the risks                            and benefits, the patient was deemed in                            satisfactory condition to undergo the procedure.                           After obtaining informed consent, the endoscope was  passed under direct vision. Throughout the                            procedure, the patient's blood pressure, pulse, and                            oxygen saturations were monitored continuously. The                            GIF [id number] #[id number] was introduced through the                            mouth, and advanced to the second part of duodenum.                            The upper GI endoscopy was  accomplished without                            difficulty. The patient tolerated the procedure                            well. Scope In: Scope Out: Findings:                 The examined esophagus was moderately tortuous and                            foreshortened d/t HH.                           A Mod hiatal hernia was present extending from 34                            cm (GE Jn) up to 40 cm (diaphragmatic hiatus).                           Localized mild inflammation characterized by                            congestion (edema) and erythema was found in the                            gastric antrum. Biopsies were taken with a cold                            forceps for histology.                           Multiple (10-12) 4 to 6 mm semi-sessile polyps with                            no bleeding and no stigmata of recent bleeding were                            found in the  gastric body. Three polyps were                            removed with a cold snare. Resection and retrieval                            were complete.                           The examined duodenum was normal. Biopsies for                            histology were taken with a cold forceps for                            evaluation of celiac disease. Complications:            No immediate complications. Estimated Blood Loss:     Estimated blood loss: none. Impression:               - Hiatal hernia.                           - Gastritis. Biopsied.                           - Multiple gastric polyps. Resected and retrieved x                            3.                           - Normal examined duodenum. Biopsied. Recommendation:           - Patient has a contact number available for                            emergencies. The signs and symptoms of potential                            delayed complications were discussed with the                            patient. Return to normal activities tomorrow.                             Written discharge instructions were provided to the                            patient.                           - Resume previous diet.                           - Increase Protonix 20 BID #180, 4 RF.                           -  Nonpharmacologic means of reflux control.                           - Await pathology results.                           - No aspirin, ibuprofen, naproxen, or other                            non-steroidal anti-inflammatory drugs.                           - The findings and recommendations were discussed                            with the patient's family. Jackquline Denmark, MD 02/16/2022 3:32:35 PM This report has been signed electronically.

## 2022-02-16 NOTE — Patient Instructions (Signed)
Thank you for letting us take care of your healthcare needs today. Please see handouts given to you on Polyps, Diverticulosis, Hemorrhoids and Hiatal Hernia. Dr. Lyndel Safe increased your Pantoprazole to 20 mg twice a day 30 min prior to breakfast and dinner this was sent to your pharmacy. Please call the office in 2 weeks if this doesn't help your symptoms. No aspirin, ibuprofen, NSAIDS.    YOU HAD AN ENDOSCOPIC PROCEDURE TODAY AT [facility]:   Refer to the procedure report that was given to you for any specific questions about what was found during the examination.  If the procedure report does not answer your questions, please call your gastroenterologist to clarify.  If you requested that your care partner not be given the details of your procedure findings, then the procedure report has been included in a sealed envelope for you to review at your convenience later.  YOU SHOULD EXPECT: Some feelings of bloating in the abdomen. Passage of more gas than usual.  Walking can help get rid of the air that was put into your GI tract during the procedure and reduce the bloating. If you had a lower endoscopy (such as a colonoscopy or flexible sigmoidoscopy) you may notice spotting of blood in your stool or on the toilet paper. If you underwent a bowel prep for your procedure, you may not have a normal bowel movement for a few days.  Please Note:  You might notice some irritation and congestion in your nose or some drainage.  This is from the oxygen used during your procedure.  There is no need for concern and it should clear up in a day or so.  SYMPTOMS TO REPORT IMMEDIATELY:  Following lower endoscopy (colonoscopy or flexible sigmoidoscopy):  Excessive amounts of blood in the stool  Significant tenderness or worsening of abdominal pains  Swelling of the abdomen that is new, acute  Fever of 100F or higher  Following upper endoscopy (EGD)  Vomiting of blood or coffee ground  material  New chest pain or pain under the shoulder blades  Painful or persistently difficult swallowing  New shortness of breath  Fever of 100F or higher  Black, tarry-looking stools  For urgent or emergent issues, a gastroenterologist can be reached at any hour by calling [phone]. Do not use MyChart messaging for urgent concerns.    DIET:  We do recommend a small meal at first, but then you may proceed to your regular diet.  Drink plenty of fluids but you should avoid alcoholic beverages for 24 hours.  ACTIVITY:  You should plan to take it easy for the rest of today and you should NOT DRIVE or use heavy machinery until tomorrow (because of the sedation medicines used during the test).    FOLLOW UP: Our staff will call the number listed on your records the next business day following your procedure.  We will call around 7:15- 8:00 am to check on you and address any questions or concerns that you may have regarding the information given to you following your procedure. If we do not reach you, we will leave a message.     If any biopsies were taken you will be contacted by phone or by letter within the next 1-3 weeks.  Please call us at [phone] if you have not heard about the biopsies in 3 weeks.    SIGNATURES/CONFIDENTIALITY: You and/or your care partner have signed paperwork which will be entered into your electronic medical record.  These signatures attest to the fact that that the information above on your After Visit Summary has been reviewed and is understood.  Full responsibility of the confidentiality of this discharge information lies with you and/or your care-partner.  

## 2022-02-16 NOTE — Progress Notes (Signed)
Called to room to assist during endoscopic procedure.  Patient ID and intended procedure confirmed with present staff. Received instructions for my participation in the procedure from the performing physician.  

## 2022-02-17 ENCOUNTER — Telehealth: Payer: Self-pay | Admitting: *Deleted

## 2022-02-17 NOTE — Telephone Encounter (Signed)
  Follow up Call-     02/16/2022    2:02 PM  Call back number  Post procedure Call Back phone  # [phone]  Permission to leave phone message Yes     Patient questions:  Do you have a fever, pain , or abdominal swelling? No. Pain Score  0 *  Have you tolerated food without any problems? Yes.    Have you been able to return to your normal activities? Yes.    Do you have any questions about your discharge instructions: Diet   No. Medications  No. Follow up visit  No.  Do you have questions or concerns about your Care? No.  Actions: * If pain score is 4 or above: No action needed, pain <4.

## 2022-02-20 ENCOUNTER — Encounter: Payer: Self-pay | Admitting: Gastroenterology

## 2022-04-09 ENCOUNTER — Telehealth: Payer: Self-pay | Admitting: Gastroenterology

## 2022-04-09 NOTE — Telephone Encounter (Signed)
Can we see about a PA for this Protonix '20mg'$  tablet to take 2 times a day please?

## 2022-04-09 NOTE — Telephone Encounter (Signed)
Patient is calling in regards to her Pantoprazole Rx states that the pharmacy only allows 90 pills per year and told her that Dr Lyndel Safe may be able to get it increased. Please advise

## 2022-04-10 ENCOUNTER — Other Ambulatory Visit (HOSPITAL_COMMUNITY): Payer: Self-pay

## 2022-04-10 MED ORDER — PANTOPRAZOLE SODIUM 20 MG PO TBEC: 20.0000 mg | DELAYED_RELEASE_TABLET | Freq: Two times a day (BID) | ORAL | 8 refills | Status: AC

## 2022-04-10 NOTE — Telephone Encounter (Signed)
Made patient aware that 30 day supply will be sent in and she will need to call us with any questions or concerns.   Pharmacy said it went through

## 2022-04-10 NOTE — Telephone Encounter (Signed)
Pharmacy Patient Advocate Encounter   Received notification that prior authorization for Pantoprazole Sodium '20MG'$  dr tablets is required/requested.   PA submitted on 04/10/2022 to [place] via [place]  Status is pending

## 2022-04-10 NOTE — Telephone Encounter (Signed)
Patient Advocate Encounter  Prior Authorization for Pantoprazole Sodium '20MG'$  OR [facility] has been approved.    PA# [id number] Key: [id number] Effective dates: 04/10/2022 through 04/09/2025  Approval letter attached to charts

## 2023-07-26 ENCOUNTER — Other Ambulatory Visit: Payer: Self-pay | Admitting: Nurse Practitioner

## 2023-07-26 DIAGNOSIS — E042 Nontoxic multinodular goiter: Secondary | ICD-10-CM

## 2023-07-29 ENCOUNTER — Ambulatory Visit
Admission: RE | Admit: 2023-07-29 | Discharge: 2023-07-29 | Disposition: A | Discharge status: Home / Self Care | Source: Ambulatory Visit | Attending: Nurse Practitioner | Admitting: Nurse Practitioner

## 2023-07-29 DIAGNOSIS — E042 Nontoxic multinodular goiter: Secondary | ICD-10-CM

## 2023-08-03 ENCOUNTER — Other Ambulatory Visit: Payer: Self-pay | Admitting: Nurse Practitioner

## 2023-08-03 DIAGNOSIS — E042 Nontoxic multinodular goiter: Secondary | ICD-10-CM

## 2023-08-27 ENCOUNTER — Encounter: Payer: Self-pay | Admitting: Nurse Practitioner

## 2023-09-06 ENCOUNTER — Ambulatory Visit
Admission: RE | Admit: 2023-09-06 | Discharge: 2023-09-06 | Disposition: A | Discharge status: Home / Self Care | Source: Ambulatory Visit | Attending: Nurse Practitioner

## 2023-09-06 ENCOUNTER — Other Ambulatory Visit (HOSPITAL_COMMUNITY)
Admission: RE | Admit: 2023-09-06 | Discharge: 2023-09-06 | Disposition: A | Discharge status: Home / Self Care | Source: Ambulatory Visit | Attending: Nurse Practitioner | Admitting: Nurse Practitioner

## 2023-09-06 DIAGNOSIS — E042 Nontoxic multinodular goiter: Secondary | ICD-10-CM

## 2023-09-08 LAB — CYTOLOGY - NON PAP
